# Patient Record
Sex: Female | Born: 1975 | Race: Black or African American | Hispanic: No | Marital: Single | Smoking: Never smoker
Health system: Southern US, Community
[De-identification: ages and names within clinical notes are randomized; demographics above are authoritative.]

## PROBLEM LIST (undated history)

## (undated) ENCOUNTER — Ambulatory Visit (HOSPITAL_COMMUNITY): Admission: EM | Payer: Self-pay | Source: Home / Self Care

## (undated) ENCOUNTER — Ambulatory Visit (HOSPITAL_COMMUNITY): Payer: Self-pay | Source: Home / Self Care

## (undated) DIAGNOSIS — T7840XA Allergy, unspecified, initial encounter: Secondary | ICD-10-CM

## (undated) HISTORY — DX: Allergy, unspecified, initial encounter: T78.40XA

## (undated) HISTORY — PX: TUBAL LIGATION: SHX77

---

## 1997-08-28 ENCOUNTER — Inpatient Hospital Stay (HOSPITAL_COMMUNITY): Admission: AD | Admit: 1997-08-28 | Discharge: 1997-08-28 | Payer: Self-pay | Admitting: *Deleted

## 1997-08-30 ENCOUNTER — Inpatient Hospital Stay (HOSPITAL_COMMUNITY): Admission: AD | Admit: 1997-08-30 | Discharge: 1997-08-30 | Payer: Self-pay | Admitting: Obstetrics

## 1997-09-29 ENCOUNTER — Ambulatory Visit (HOSPITAL_COMMUNITY): Admission: RE | Admit: 1997-09-29 | Discharge: 1997-09-29 | Payer: Self-pay | Admitting: Obstetrics

## 1997-10-29 ENCOUNTER — Inpatient Hospital Stay (HOSPITAL_COMMUNITY): Admission: AD | Admit: 1997-10-29 | Discharge: 1997-10-31 | Payer: Self-pay | Admitting: Obstetrics

## 1998-02-04 ENCOUNTER — Inpatient Hospital Stay (HOSPITAL_COMMUNITY): Admission: AD | Admit: 1998-02-04 | Discharge: 1998-02-04 | Payer: Self-pay | Admitting: *Deleted

## 1998-02-05 ENCOUNTER — Inpatient Hospital Stay (HOSPITAL_COMMUNITY): Admission: AD | Admit: 1998-02-05 | Discharge: 1998-02-07 | Payer: Self-pay | Admitting: Obstetrics

## 1998-03-21 ENCOUNTER — Emergency Department (HOSPITAL_COMMUNITY): Admission: EM | Admit: 1998-03-21 | Discharge: 1998-03-21 | Payer: Self-pay | Admitting: Emergency Medicine

## 1999-04-04 ENCOUNTER — Emergency Department (HOSPITAL_COMMUNITY): Admission: EM | Admit: 1999-04-04 | Discharge: 1999-04-04 | Payer: Self-pay | Admitting: Emergency Medicine

## 1999-10-10 ENCOUNTER — Emergency Department (HOSPITAL_COMMUNITY): Admission: EM | Admit: 1999-10-10 | Discharge: 1999-10-10 | Payer: Self-pay | Admitting: Emergency Medicine

## 1999-10-10 ENCOUNTER — Encounter: Payer: Self-pay | Admitting: Emergency Medicine

## 2000-04-03 ENCOUNTER — Ambulatory Visit (HOSPITAL_COMMUNITY): Admission: RE | Admit: 2000-04-03 | Discharge: 2000-04-03 | Payer: Self-pay | Admitting: *Deleted

## 2000-04-22 ENCOUNTER — Encounter: Admission: RE | Admit: 2000-04-22 | Discharge: 2000-04-22 | Payer: Self-pay | Admitting: Obstetrics & Gynecology

## 2000-05-22 ENCOUNTER — Observation Stay (HOSPITAL_COMMUNITY): Admission: AD | Admit: 2000-05-22 | Discharge: 2000-05-23 | Payer: Self-pay | Admitting: Obstetrics & Gynecology

## 2000-05-22 ENCOUNTER — Encounter: Payer: Self-pay | Admitting: Obstetrics & Gynecology

## 2000-06-10 ENCOUNTER — Inpatient Hospital Stay (HOSPITAL_COMMUNITY): Admission: AD | Admit: 2000-06-10 | Discharge: 2000-06-14 | Payer: Self-pay | Admitting: *Deleted

## 2000-06-10 ENCOUNTER — Encounter: Admission: RE | Admit: 2000-06-10 | Discharge: 2000-06-10 | Payer: Self-pay | Admitting: Obstetrics & Gynecology

## 2000-06-13 ENCOUNTER — Encounter: Payer: Self-pay | Admitting: *Deleted

## 2000-06-16 ENCOUNTER — Inpatient Hospital Stay (HOSPITAL_COMMUNITY): Admission: AD | Admit: 2000-06-16 | Discharge: 2000-06-16 | Payer: Self-pay | Admitting: Obstetrics

## 2000-07-21 ENCOUNTER — Inpatient Hospital Stay (HOSPITAL_COMMUNITY): Admission: AD | Admit: 2000-07-21 | Discharge: 2000-07-23 | Payer: Self-pay | Admitting: *Deleted

## 2000-07-21 ENCOUNTER — Encounter (INDEPENDENT_AMBULATORY_CARE_PROVIDER_SITE_OTHER): Payer: Self-pay

## 2000-07-21 ENCOUNTER — Encounter (INDEPENDENT_AMBULATORY_CARE_PROVIDER_SITE_OTHER): Payer: Self-pay | Admitting: Specialist

## 2001-02-19 ENCOUNTER — Emergency Department (HOSPITAL_COMMUNITY): Admission: EM | Admit: 2001-02-19 | Discharge: 2001-02-19 | Payer: Self-pay | Admitting: Emergency Medicine

## 2002-10-02 ENCOUNTER — Emergency Department (HOSPITAL_COMMUNITY): Admission: EM | Admit: 2002-10-02 | Discharge: 2002-10-02 | Payer: Self-pay | Admitting: Emergency Medicine

## 2002-10-12 ENCOUNTER — Emergency Department (HOSPITAL_COMMUNITY): Admission: EM | Admit: 2002-10-12 | Discharge: 2002-10-12 | Payer: Self-pay | Admitting: Emergency Medicine

## 2003-08-10 ENCOUNTER — Emergency Department (HOSPITAL_COMMUNITY): Admission: EM | Admit: 2003-08-10 | Discharge: 2003-08-10 | Payer: Self-pay | Admitting: Emergency Medicine

## 2003-11-23 ENCOUNTER — Emergency Department (HOSPITAL_COMMUNITY): Admission: EM | Admit: 2003-11-23 | Discharge: 2003-11-23 | Payer: Self-pay | Admitting: *Deleted

## 2003-11-24 ENCOUNTER — Inpatient Hospital Stay (HOSPITAL_COMMUNITY): Admission: RE | Admit: 2003-11-24 | Discharge: 2003-11-26 | Payer: Self-pay | Admitting: Otolaryngology

## 2004-02-07 ENCOUNTER — Emergency Department (HOSPITAL_COMMUNITY): Admission: EM | Admit: 2004-02-07 | Discharge: 2004-02-07 | Payer: Self-pay | Admitting: Emergency Medicine

## 2004-02-14 ENCOUNTER — Emergency Department (HOSPITAL_COMMUNITY): Admission: EM | Admit: 2004-02-14 | Discharge: 2004-02-15 | Payer: Self-pay | Admitting: Emergency Medicine

## 2004-08-27 ENCOUNTER — Emergency Department (HOSPITAL_COMMUNITY): Admission: EM | Admit: 2004-08-27 | Discharge: 2004-08-27 | Payer: Self-pay | Admitting: Emergency Medicine

## 2004-11-26 ENCOUNTER — Emergency Department (HOSPITAL_COMMUNITY): Admission: EM | Admit: 2004-11-26 | Discharge: 2004-11-26 | Payer: Self-pay | Admitting: Family Medicine

## 2007-05-03 ENCOUNTER — Emergency Department (HOSPITAL_COMMUNITY): Admission: EM | Admit: 2007-05-03 | Discharge: 2007-05-03 | Payer: Self-pay | Admitting: Emergency Medicine

## 2007-11-01 ENCOUNTER — Emergency Department (HOSPITAL_COMMUNITY): Admission: EM | Admit: 2007-11-01 | Discharge: 2007-11-01 | Payer: Self-pay | Admitting: Emergency Medicine

## 2007-11-03 ENCOUNTER — Emergency Department (HOSPITAL_COMMUNITY): Admission: EM | Admit: 2007-11-03 | Discharge: 2007-11-03 | Payer: Self-pay | Admitting: Emergency Medicine

## 2008-09-10 ENCOUNTER — Emergency Department (HOSPITAL_COMMUNITY): Admission: EM | Admit: 2008-09-10 | Discharge: 2008-09-10 | Payer: Self-pay | Admitting: Emergency Medicine

## 2010-01-17 ENCOUNTER — Emergency Department (HOSPITAL_COMMUNITY): Admission: EM | Admit: 2010-01-17 | Discharge: 2010-01-17 | Payer: Self-pay | Admitting: Emergency Medicine

## 2010-04-15 ENCOUNTER — Emergency Department (HOSPITAL_COMMUNITY): Admission: EM | Admit: 2010-04-15 | Discharge: 2010-04-15 | Payer: Self-pay | Admitting: Emergency Medicine

## 2010-10-03 LAB — WET PREP, GENITAL
Trich, Wet Prep: NONE SEEN
Yeast Wet Prep HPF POC: NONE SEEN

## 2010-10-03 LAB — URINALYSIS, ROUTINE W REFLEX MICROSCOPIC
Bilirubin Urine: NEGATIVE
Glucose, UA: NEGATIVE mg/dL
Hgb urine dipstick: NEGATIVE
Ketones, ur: NEGATIVE mg/dL
Nitrite: NEGATIVE
Protein, ur: NEGATIVE mg/dL
Specific Gravity, Urine: 1.025 (ref 1.005–1.030)
Urobilinogen, UA: 1 mg/dL (ref 0.0–1.0)
pH: 7.5 (ref 5.0–8.0)

## 2010-10-03 LAB — URINE MICROSCOPIC-ADD ON

## 2010-10-03 LAB — GC/CHLAMYDIA PROBE AMP, GENITAL
Chlamydia, DNA Probe: NEGATIVE
GC Probe Amp, Genital: NEGATIVE

## 2010-10-14 ENCOUNTER — Inpatient Hospital Stay (INDEPENDENT_AMBULATORY_CARE_PROVIDER_SITE_OTHER)
Admission: RE | Admit: 2010-10-14 | Discharge: 2010-10-14 | Disposition: A | Discharge status: Home / Self Care | Payer: Medicaid Other | Source: Ambulatory Visit | Attending: Family Medicine | Admitting: Family Medicine

## 2010-10-14 DIAGNOSIS — L989 Disorder of the skin and subcutaneous tissue, unspecified: Secondary | ICD-10-CM

## 2010-12-06 NOTE — Op Note (Signed)
NAMESADEEL, FIDDLER                            ACCOUNT NO.:  [id number]   MEDICAL RECORD NO.:  [id number]                   PATIENT TYPE:  INP   LOCATION:  5019                                 FACILITY:  [facility]   PHYSICIAN:  Suzanna Obey, M.D.                    DATE OF BIRTH:  June 27, 1976   DATE OF PROCEDURE:  11/25/2003  DATE OF DISCHARGE:                                 OPERATIVE REPORT   PREOPERATIVE DIAGNOSIS:  Left paratonsillar abscess.   POSTOPERATIVE DIAGNOSIS:  Left paratonsillar abscess.   SURGICAL PROCEDURE:  Incision and drainage of left paratonsillar abscess.   ANESTHESIA:  General endotracheal tube.   ESTIMATED BLOOD LOSS:  Approximately 5 mL.   INDICATIONS:  This is a 35 year old, who has had problems with increasing  sore throat and pain.  She was seen in the office and not noted to have a  problem with a paratonsillar abscess but rather just exudative material that  was on both tonsils.  She has not been able to take any fluids, and symptoms  have been going on for 2-3 days, so she was admitted to be changed to  intravenous antibiotics.  She, on the next morning, did not have any  substantial improvement.  She was having left ear pain.  She has bulging now  of the soft palate on the left side.  She was informed of the risks and  benefits of the procedure including bleeding, infection, scarring, chronic  pain, risk of the anesthetic.  All questions are answered, and consent was  obtained.   OPERATION:  The patient taken to the operating room and placed in supine  position.  After adequate general endotracheal tube anesthesia, was draped,  and then a Crowe-Davis mouth gag was inserted, retracted, and suspended from  the Mayo stand.  An anterior tonsillar pillar incision was made with  electrocautery.  Tonsil hemostat used to open up the paratonsillar space  which did have significant purulent material.  There was also a site of the  superior pole of the tonsil where  the abscess was trying to spontaneously  drain.  This was irrigated copiously with saline.  The hypopharynx,  esophagus, and stomach were suctioned with the NG tube.  The Crowe-Davis was  removed.  The patient was awakened and brought to recovery in stable  condition.  Counts correct.                                               Suzanna Obey, M.D.    Cordelia Pen  D:  11/25/2003  T:  11/26/2003  Job:  [id number]

## 2010-12-06 NOTE — Op Note (Signed)
[facility]  Patient:    Darlene Evans, Darlene Evans                         MRN: [id number] Proc. Date: 07/22/00 Adm. Date:  [id number] Disc. Date: [id number] Attending:  Tammi Sou Dictator:   Jamey Reas, M.D.                           Operative Report  DATE OF BIRTH:                1975-10-01  PREOPERATIVE DIAGNOSES:       Multiparity, desires permanent sterilization.  POSTOPERATIVE DIAGNOSES:      Multiparity, desires permanent sterilization.  PROCEDURE:                    Postpartum tubal ligation-Pomeroy method.  SURGEON:                      Charles A. Clearance Coots, M.D.  ASSISTANT:                    Jamey Reas, M.D.  ANESTHESIA:                   Epidural.  COMPLICATIONS:                None.  ESTIMATED BLOOD LOSS:         Less than 20 cc.  FLUIDS:                       Lactated Ringers 500 cc.  INDICATIONS:                  A 35 year old G61, P3-0-1-4 postpartum day #1 status post NSVD who desires permanent sterilization.  Risks and benefits of procedure discussed with patient including the risk of failure 3-5 in 1000 with increased risk of ectopic gestation if pregnancy occurs.  FINDINGS:                     Normal uterus, tubes, and ovaries.  PROCEDURE:                    Patient taken to the operating room where epidural was found to be adequate.  Small transverse infraumbilical skin incision was then made with the scalpel.  Incision was carried down through the underlying fascia until the peritoneum was identified and entered. Peritoneum was noted to be free of any adhesions and the incision was then extended with Metzenbaum scissors.  The patients right fallopian tube was then identified, brought to the incision, grasped with a Babcock clamp.  The tube was then followed out to the fimbria.  The Babcock clamp was then used to grasp the tube approximately 4 cm from the cornual region.  A 3 cm segment of tube  was then ligated with two free ties of plain gut and excised.  Good hemostasis was noted and the tube was returned to the abdomen.  The left fallopian tube was then ligated and a 3 cm segment excised in a similar fashion.  Excellent hemostasis was noted and the tube returned to the abdomen. Peritoneum and fascia were then closed in a single layer using 3-0 Vicryl suture.  The skin was closed in a subcuticular fashion using 4-0 Monocryl. The patient tolerated procedure well.  Sponge, lap, and needle counts were correct x 2.  Patient was taken to the recovery room in stable condition.  PATHOLOGY:                    Segments of right and left fallopian tubes.DD: 07/22/00 TD:  07/22/00 Job: [id number]

## 2010-12-06 NOTE — Discharge Summary (Signed)
[facility]  Patient:    Darlene Evans, Darlene Evans                         MRN: [id number] Adm. Date:  [id number] Disc. Date: 05/23/00 Attending:  Antionette Char Dictator:   Zella Ball, M.D.                           Discharge Summary  ADDENDUM  DISPOSITION:                  Patient was discharged to home in good condition.  MEDICATIONS:                  1. Prenatal vitamins.                               2. Phenergan 12.5 mg p.o. p.r.n. nausea.  SPECIAL INSTRUCTIONS:         Patient was instructed to keep herself well hydrated, drinking plenty of fluids.  FOLLOW-UP:                    Patient was instructed to follow up with her regular physician in one week. DD:  05/23/00 TD:  05/24/00 Job: [id number]

## 2010-12-06 NOTE — Discharge Summary (Signed)
[facility]  Patient:    Darlene Evans, Darlene Evans                         MRN: [id number] Adm. Date:  [id number] Disc. Date: 05/23/00 Attending:  Antionette Char Dictator:   Zella Ball, M.D.                           Discharge Summary  DATE OF BIRTH:                07/12/1976  DISCHARGE DIAGNOSES:          1. Intrauterine pregnancy, twin gestation, at 29                                  weeks.                               2. Gastroenteritis.  PRESENTING HISTORY:           This is a 35 year old G4, P2-0-1-2 who presented at 78 and 4 weeks by LMP and an ultrasound at 20 weeks who came in complaining of pain in her lower abdomen and xiphoid that had started the previous morning.  Nothing which increased or decreased the pain but she did have nausea and vomiting x 3 and felt dizzy when she stood up.  Otherwise, patient had positive fetal movements, was not feeling any contractions.  No spontaneous rupture of membranes and no bleeding.  However, she did have some discharge that she has had for apparently quite a while.  OBSTETRICAL HISTORY:          Patient had therapeutic abortion in 1994. Normal spontaneous vaginal delivery 6 pound 7 ounce in 1999.  A 6 pound 10 ounce normal spontaneous vaginal delivery in 1995.  SOCIAL HISTORY:               Patient does not smoke, drink alcohol, or use other substances.  PRENATAL HISTORY:             Patient was GBS negative in September 2001. Also, GC and chlamydia negative in September 2001.  Hep B surface antigen negative.  Her blood type is O+.  Negative VDRL.  PHYSICAL EXAMINATION:  VITAL SIGNS:                  Temperature 99.6.  Blood pressure 112/75.  Pulse 98.  Fetal heart tones 145-155.  No accelerations.  No decelerations.  Some uterine irritability noted on the monitor.  HEENT:                        Within normal limits with some submandibular lymphadenopathy.  LUNGS:                         Clear to auscultation bilaterally.  CHEST:                        Regular rate and rhythm with 2/6 systolic murmur.  ABDOMEN:                      Gravid.  PELVIC:  Cervix was soft, fingertip, and 30% effaced. Infant was in cephalic position.  LABORATORIES:                 Patient had an UA that was negative for nitrites, negative for leuk esterase.                                Patient was admitted with the diagnosis of dehydration, likely secondary to gastroenteritis.  HOSPITAL COURSE:              Patient was placed on IV Phenergan and fluid repleted with good change in status.  Patient vomited a couple more times while in the hospital but nothing in the previous 14 or so hours prior to discharge.  With hydration patient stated that she felt much better and overall stabilized.  Patient did have an obstetrical ultrasound while she was in the hospital and it showed for fetus 1 posterior grade 1 placenta, amniotic fluid index within normal limits for 27 weeks, cardiac motion detected, body and limb movements detected, lateral ventricles seen.  Everything appeared grossly normal on the ultrasound for fetus B.  Estimated fetal weight was 842 g.  Head circumference 23.9 cm, AC diameter 21.9, and FL 4.8, biparametal diameter 6.1.  For fetus A posterior grade 1 placenta, amniotic fluid index within normal limits, everything grossly normal on fetal motion and anatomy except breathing motions were not seen, biparametal diameter 6.9, head circumference 24.6, AC 23.6, FL 5.0, estimated fetal weight 1119 g. DD:  05/23/00 TD:  05/24/00 Job: [id number]

## 2010-12-06 NOTE — Discharge Summary (Signed)
[facility]  Patient:    Darlene Evans, Darlene Evans                         MRN: [id number] Adm. Date:  [id number] Disc. Date: 05/23/00 Attending:  Antionette Char Dictator:   Zella Ball, M.D.                           Discharge Summary  ADDENDUM  DISPOSITION:                  Patient was discharged to home in good health. DD:  05/23/00 TD:  05/24/00 Job: [id number]

## 2010-12-06 NOTE — Discharge Summary (Signed)
[facility]  Patient:    Darlene Evans, Darlene Evans                         MRN: [id number] Adm. Date:  [id number] Disc. Date: [id number] Attending:  Michaelle Copas Dictator:   Marvis Moeller, C.N.M.                           Discharge Summary  ADMISSION DIAGNOSES:          1. A 30 and 1/7 week intrauterine pregnancy,                                  twin gestation.                               2. Threatened preterm labor with cervical                                  change.  DISCHARGE DIAGNOSES:          1. A 30 and 5/7 week intrauterine pregnancy with                                  discordant twin gestation.                               2. Threatened preterm labor, stable, status post                                  Unasyn and betamethasone.  HISTORY OF PRESENT ILLNESS:   The patient is a 35 year old G4, P 2-0-1-2.  She presented for her scheduled visit at [facility] clinic on June 10, 2000, and was noted to have a cervix that was 1, 40%, with significant lower uterine segment development.  Of note, her ultrasound on June 01, 2000, had revealed a cervical length that was decreased to 1.9 cm with funneling present.  It was also noted on this scan that there was discordance of the twins with A in the 65th percentile and B in the 20th percentile.  Amniotic fluid volume was said to be normal x 2, and separating membrane was seen.  On day of admission, the patient was unaware of contractions, but had been feeling some lower abdominal pressure.  She denied leaking or bleeding.  No irritated vaginal discharge, no UTI symptoms, or preeclampsia symptoms.  Her prenatal course had been significant for late entry at [facility] at 22 weeks with transferal to high risk clinic due to the twin gestation.  PRENATAL LABORATORY DATA:     Significant for negative GBS, negative GC, and negative Chlamydia.  OBSTETRICAL AND GYNECOLOGIC HISTORY:  Significant  for an elective termination at 18 weeks due to fetal anomalies followed by two full-term normal spontaneous vaginal deliveries which occurred in 1995 and 1999.  Negative history of STD.  SOCIAL HISTORY:               Negative for tobacco, alcohol, or illicit drug use.  ADMISSION PHYSICAL EXAMINATION:  VITAL  SIGNS:                  WNL with blood pressure 113/72.  PELVIC:                       There was uterine irritability, but no uterine contractions on toco.  Fetal heart rate was in the 140s and reactive.  No decelerations.  Cervical exam was as above.  CHEST:                        WNL.  CARDIOVASCULAR:               WNL.  ABDOMEN:                      Soft, nontender, and consistent with a twin gestation at 30 weeks.  HOSPITAL COURSE:              The patient was admitted for tocolysis with magnesium sulfate and received a 4-gram load then run at 2 g/h.  She also received Unasyn 3 g IV q.6h., and she received betamethasone 12.5 mg x 2 over 24 hours.  Fetal fibronectin was sent which was subsequently found that the lab made an error, and this result was not available.  She remained quieted with a therapeutic magnesium level.  Of note, her hemoglobin was 7.9 with a low MCV of 81, and she was given iron therapy during hospitalization.  On hospital day #4, it was noted that her cervix was unchanged from admission exam, and both fetal heart rates were reassuring, the toco revealed at most one to two uterine contractions per hour, and all of her GC, CT, and GBS cultures were negative from this hospitalization.  Her magnesium infusion was discontinued one day prior to day of discharge.  Her ultrasound during hospitalization revealed twin A 36th percentile, twin B less than 10th percentile, both were cephalic, normal amniotic fluid volume x 2, and cervix was 1.8 cm essentially the same as it was three weeks ago.  DISPOSITION/FOLLOWUP:         On June 14, 2000, the patient was  discharged home on bed rest with follow-up at the high risk clinic in three days.  She was begun on IUGR protocol due to twin B being less than 10th percentile.  DISCHARGE MEDICATIONS:        1. Prenatal vitamin one q.d.                               2. FeSO4 325 mg one p.o. b.i.d.                               3. She was to take only Tylenol as necessary for                                  pain one or two q.4h. p.r.n.                               4. She was not given any oral tocolysis.  CONDITION ON DISCHARGE:       Stable. DD:  09/07/00 TD:  09/07/00 Job: [id number]

## 2011-04-20 ENCOUNTER — Emergency Department (HOSPITAL_COMMUNITY)
Admission: EM | Admit: 2011-04-20 | Discharge: 2011-04-20 | Disposition: A | Discharge status: Home / Self Care | Payer: Self-pay | Attending: Emergency Medicine | Admitting: Emergency Medicine

## 2011-04-20 DIAGNOSIS — K029 Dental caries, unspecified: Secondary | ICD-10-CM | POA: Insufficient documentation

## 2011-04-20 DIAGNOSIS — K089 Disorder of teeth and supporting structures, unspecified: Secondary | ICD-10-CM | POA: Insufficient documentation

## 2011-11-04 ENCOUNTER — Ambulatory Visit: Payer: Self-pay | Admitting: Family Medicine

## 2011-11-04 VITALS — BP 123/78 | HR 88 | Temp 97.5°F | Resp 16 | Ht 63.0 in | Wt 130.8 lb

## 2011-11-04 DIAGNOSIS — Z021 Encounter for pre-employment examination: Secondary | ICD-10-CM

## 2011-11-04 DIAGNOSIS — Z0289 Encounter for other administrative examinations: Secondary | ICD-10-CM

## 2011-11-04 NOTE — Progress Notes (Signed)
  [facility]:  Office Visit  Chief Complaint:  Chief Complaint  Patient presents with  . Annual Exam    admin    HPI: Darlene Evans is a 36 y.o. 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 She states she is UTD on her TB skin test and tetanus. She denies ever having TB. No problems physically, No cough, hemoptysis, SOB/CP. No anxiety, depression, SI/HI.   Past Medical History  Diagnosis Date  . Allergy    Past Surgical History  Procedure Date  . Tubal ligation    History   Social History  . Marital Status: Single    Spouse Name: N/A    Number of Children: N/A  . Years of Education: N/A   Social History Main Topics  . Smoking status: Never Smoker   . Smokeless tobacco: None  . Alcohol Use: No  . Drug Use: No  . Sexually Active: None   Other Topics Concern  . None   Social History Narrative  . None   Family History  Problem Relation Age of Onset  . Heart disease Maternal Grandmother    No Known Allergies Prior to Admission medications   Not on File     ROS: The patient denies fevers, chills, night sweats, unintentional weight loss, chest pain, palpitations, wheezing, dyspnea on exertion, nausea, vomiting, abdominal pain, dysuria, hematuria, melena, numbness, weakness, or tingling.   All other systems have been reviewed and were otherwise negative with the exception of those mentioned in the HPI and as above.    PHYSICAL EXAM: Filed Vitals:   11/04/11 1106  BP: 123/78  Pulse: 88  Temp: 97.5 F (36.4 C)  Resp: 16   Filed Vitals:   11/04/11 1106  Height: 5\' 3"  (1.6 m)  Weight: 130 lb 12.8 oz (59.33 kg)   Body mass index is 23.17 kg/(m^2).  General: Alert, no acute distress HEENT:  Normocephalic, atraumatic, oropharynx patent. EOMI, PERRLA Cardiovascular:  Regular rate and rhythm, no rubs murmurs or gallops.  No Carotid bruits, radial pulse intact. No pedal edema.  Respiratory: Clear to auscultation  bilaterally.  No wheezes, rales, or rhonchi.  No cyanosis, no use of accessory musculature GI: No organomegaly, abdomen is soft and non-tender, positive bowel sounds.  No masses. Skin: No rashes. Neurologic: Facial musculature symmetric. Psychiatric: Patient is appropriate throughout our interaction. Lymphatic: No cervical lymphadenopathy Musculoskeletal: Gait intact.   LABS:    EKG/XRAY:   Primary read interpreted by Dr. Conley Rolls at [facility].   ASSESSMENT/PLAN: Encounter Diagnosis  Name Primary?  . Physical exam, pre-employment Yes    Patient doing well. No physical restrictions based on today's exam.    Roark Rufo PHUONG, DO 11/07/2011 9:50 AM

## 2012-01-05 ENCOUNTER — Emergency Department (HOSPITAL_COMMUNITY)
Admission: EM | Admit: 2012-01-05 | Discharge: 2012-01-05 | Disposition: A | Discharge status: Home / Self Care | Payer: Self-pay | Attending: Emergency Medicine | Admitting: Emergency Medicine

## 2012-01-05 ENCOUNTER — Emergency Department (HOSPITAL_COMMUNITY): Payer: Self-pay

## 2012-01-05 ENCOUNTER — Encounter (HOSPITAL_COMMUNITY): Payer: Self-pay | Admitting: Emergency Medicine

## 2012-01-05 DIAGNOSIS — M62838 Other muscle spasm: Secondary | ICD-10-CM | POA: Insufficient documentation

## 2012-01-05 DIAGNOSIS — R079 Chest pain, unspecified: Secondary | ICD-10-CM | POA: Insufficient documentation

## 2012-01-05 DIAGNOSIS — M542 Cervicalgia: Secondary | ICD-10-CM | POA: Insufficient documentation

## 2012-01-05 LAB — CBC
HCT: 33.1 % — ABNORMAL LOW (ref 36.0–46.0)
Hemoglobin: 10.5 g/dL — ABNORMAL LOW (ref 12.0–15.0)
MCH: 25.3 pg — ABNORMAL LOW (ref 26.0–34.0)
MCHC: 31.7 g/dL (ref 30.0–36.0)
MCV: 79.8 fL (ref 78.0–100.0)
Platelets: 306 K/uL (ref 150–400)
RBC: 4.15 MIL/uL (ref 3.87–5.11)
RDW: 18.6 % — ABNORMAL HIGH (ref 11.5–15.5)
WBC: 4.3 K/uL (ref 4.0–10.5)

## 2012-01-05 LAB — POCT I-STAT TROPONIN I
Troponin i, poc: 0 ng/mL (ref 0.00–0.08)
Troponin i, poc: 0 ng/mL (ref 0.00–0.08)

## 2012-01-05 LAB — DIFFERENTIAL
Basophils Absolute: 0 K/uL (ref 0.0–0.1)
Basophils Relative: 0 % (ref 0–1)
Eosinophils Absolute: 0.1 K/uL (ref 0.0–0.7)
Eosinophils Relative: 1 % (ref 0–5)
Lymphocytes Relative: 47 % — ABNORMAL HIGH (ref 12–46)
Lymphs Abs: 2 K/uL (ref 0.7–4.0)
Monocytes Absolute: 0.3 K/uL (ref 0.1–1.0)
Monocytes Relative: 7 % (ref 3–12)
Neutro Abs: 1.9 K/uL (ref 1.7–7.7)
Neutrophils Relative %: 44 % (ref 43–77)

## 2012-01-05 LAB — POCT I-STAT, CHEM 8
BUN: 13 mg/dL (ref 6–23)
Calcium, Ion: 1.29 mmol/L (ref 1.12–1.32)
Chloride: 108 mEq/L (ref 96–112)
Glucose, Bld: 83 mg/dL (ref 70–99)

## 2012-01-05 MED ORDER — CYCLOBENZAPRINE HCL 10 MG PO TABS
10.0000 mg | ORAL_TABLET | Freq: Once | ORAL | Status: AC
  Administered 2012-01-05: 10 mg via ORAL
  Filled 2012-01-05: qty 1

## 2012-01-05 MED ORDER — KETOROLAC TROMETHAMINE 60 MG/2ML IM SOLN
60.0000 mg | Freq: Once | INTRAMUSCULAR | Status: AC
  Administered 2012-01-05: 60 mg via INTRAMUSCULAR
  Filled 2012-01-05: qty 2

## 2012-01-05 MED ORDER — CYCLOBENZAPRINE HCL 10 MG PO TABS: 10.0000 mg | ORAL_TABLET | Freq: Two times a day (BID) | ORAL | Status: AC | PRN

## 2012-01-05 NOTE — ED Provider Notes (Signed)
History     CSN: [id number]  Arrival date & time 01/05/12  1016   First MD Initiated Contact with Patient 01/05/12 1522      Chief Complaint  Patient presents with  . Chest Pain     Patient is a 36 y.o. female presenting with neck injury. The history is provided by the patient.  Neck Injury This is a new problem. The current episode started in the past 7 days (3 days ago). The problem occurs constantly. The problem has been unchanged. Associated symptoms include chest pain (at times neck pain is so great that it feels like it is in her chest) and myalgias (left neck pain). Pertinent negatives include no chills, coughing, fever, neck pain, numbness or weakness. The symptoms are aggravated by bending and twisting (twisting of neck). She has tried NSAIDs for the symptoms. The treatment provided moderate relief.    Past Medical History  Diagnosis Date  . Allergy     Past Surgical History  Procedure Date  . Tubal ligation     Family History  Problem Relation Age of Onset  . Heart disease Maternal Grandmother     History  Substance Use Topics  . Smoking status: Never Smoker   . Smokeless tobacco: Not on file  . Alcohol Use: No    OB History    Grav Para Term Preterm Abortions TAB SAB Ect Mult Living                  Review of Systems  Constitutional: Negative for fever, chills, activity change and appetite change.  HENT: Negative for neck pain and neck stiffness.   Respiratory: Negative for cough, chest tightness, shortness of breath and wheezing.   Cardiovascular: Positive for chest pain (at times neck pain is so great that it feels like it is in her chest). Negative for palpitations.  Genitourinary: Negative for dysuria, decreased urine volume and difficulty urinating.  Musculoskeletal: Positive for myalgias (left neck pain).  Neurological: Negative for dizziness, seizures, syncope, facial asymmetry, speech difficulty, weakness, light-headedness and numbness.    Psychiatric/Behavioral: Negative for confusion and agitation.  All other systems reviewed and are negative.    Allergies  Review of patient's allergies indicates no known allergies.  Home Medications   Current Outpatient Rx  Name Route Sig Dispense Refill  . BENADRYL PO Oral Take 1 tablet by mouth at bedtime.    Marland Kitchen CLARITIN PO Oral Take 1 tablet by mouth daily.    . ALEVE PO Oral Take 4 tablets by mouth once as needed. For pain    . OVER THE COUNTER MEDICATION Both Eyes Place 2 drops into both eyes daily as needed. For allergies. Visine.      BP 132/87  Pulse 86  Temp 98.5 F (36.9 C) (Oral)  Resp 16  SpO2 100%  LMP 12/22/2011  Physical Exam  Nursing note and vitals reviewed. Constitutional: She is oriented to person, place, and time. She appears well-developed and well-nourished.  HENT:  Head: Normocephalic and atraumatic.  Right Ear: External ear normal.  Left Ear: External ear normal.  Nose: Nose normal.  Mouth/Throat: Oropharynx is clear and moist. No oropharyngeal exudate.  Eyes: Conjunctivae are normal. Pupils are equal, round, and reactive to light.  Neck: Normal range of motion. Neck supple.    Cardiovascular: Normal rate, regular rhythm, normal heart sounds and intact distal pulses.  Exam reveals no gallop and no friction rub.   No murmur heard. Pulmonary/Chest: Effort normal and breath sounds  normal. No respiratory distress. She has no wheezes. She has no rales. She exhibits no tenderness.  Musculoskeletal: Normal range of motion. She exhibits no edema and no tenderness.  Neurological: She is alert and oriented to person, place, and time. She displays normal reflexes. No cranial nerve deficit. She exhibits normal muscle tone. Coordination normal.  Skin: Skin is warm and dry.  Psychiatric: She has a normal mood and affect. Her behavior is normal. Judgment and thought content normal.    ED Course  Procedures (including critical care time)  Labs Reviewed   CBC - Abnormal; Notable for the following:    Hemoglobin 10.5 (*)     HCT 33.1 (*)     MCH 25.3 (*)     RDW 18.6 (*)     All other components within normal limits  DIFFERENTIAL - Abnormal; Notable for the following:    Lymphocytes Relative 47 (*)     All other components within normal limits  POCT I-STAT, CHEM 8 - Abnormal; Notable for the following:    Hemoglobin 11.9 (*)     HCT 35.0 (*)     All other components within normal limits  POCT I-STAT TROPONIN I  POCT I-STAT TROPONIN I   Dg Chest 2 View  01/05/2012  *RADIOLOGY REPORT*  Clinical Data:  Shortness of breath, chest and left shoulder pain  CHEST - 2 VIEW  Comparison: None  Findings: Normal heart size, mediastinal contours, and pulmonary vascularity. Lungs clear. No pleural effusion or pneumothorax. No acute osseous findings.  IMPRESSION: No acute abnormalities.  Original Report Authenticated By: Lollie Marrow, M.D.     1. Neck muscle spasm      Date: 01/05/2012  Rate: 86 bpm  Rhythm: normal sinus rhythm  QRS Axis: normal  Intervals: normal  ST/T Wave abnormalities: normal  Conduction Disutrbances:none  Narrative Interpretation: No evidence of acute ischemia or arrythmia  Old EKG Reviewed: none available    MDM  36 yo otherwise healthy F presents for 3 days of left paraspinal neck pain, reproducible with rotation of head and improved with abduction of ipsilateral arm. No associated neuro si/sx. Suspect muscle strain and spasm. Triage had ordered serial troponins because pain "was so great it went to" pt's chest; these serial enzymes were negative in this patient with a clinical picture not concerning for ACS. Pain resolved with doses of IM toradol and PO flexeril. Will prescribe short course of PO flexeril. Pt given instructions to not drive while under the influence of Flexeril. Patient given return precautions, including worsening of signs or symptoms. Patient instructed to follow-up with primary care physician.           Clemetine Marker, MD 01/05/12 [phone]

## 2012-01-05 NOTE — ED Provider Notes (Signed)
I saw and evaluated the patient, reviewed the resident's note and I agree with the findings and plan.  Reviewed EKG  Pt with Bil neck muscle spasm. No concern for ACS.   Elyan Vanwieren B. Bernette Mayers, MD 01/05/12 1630

## 2012-01-05 NOTE — ED Notes (Signed)
Patient is alert and oriented x3.  Patient did understand discharge instructions with no questions asked.  Patient's vitals were recorded.  Mr. Bonney Aid is taking patient home.  No further action taken.

## 2012-01-05 NOTE — ED Notes (Signed)
Pt. Returned from Xray 

## 2012-01-05 NOTE — ED Notes (Signed)
Onset 3 days ago posterior neck pain radiating to center of chest. States pain slight relief with aleeve 4 tablets. Currently pain 9/10 achy dull.

## 2012-01-05 NOTE — Discharge Instructions (Signed)
Muscle Strain A muscle strain, or pulled muscle, occurs when a muscle is over-stretched. A small number of muscle fibers may also be torn. This is especially common in athletes. This happens when a sudden violent force placed on a muscle pushes it past its capacity. Usually, recovery from a pulled muscle takes 1 to 2 weeks. But complete healing will take 5 to 6 weeks. There are millions of muscle fibers. Following injury, your body will usually return to normal quickly. HOME CARE INSTRUCTIONS   While awake, apply ice to the sore muscle for 15 to 20 minutes each hour for the first 2 days. Put ice in a plastic bag and place a towel between the bag of ice and your skin.   Do not use the pulled muscle for several days. Do not use the muscle if you have pain.   You may wrap the injured area with an elastic bandage for comfort. Be careful not to bind it too tightly. This may interfere with blood circulation.   Only take over-the-counter or prescription medicines for pain, discomfort, or fever as directed by your caregiver. Do not use aspirin as this will increase bleeding (bruising) at injury site.   Warming up before exercise helps prevent muscle strains.  SEEK MEDICAL CARE IF:  There is increased pain or swelling in the affected area. MAKE SURE YOU:   Understand these instructions.   Will watch your condition.   Will get help right away if you are not doing well or get worse.  Document Released: 07/07/2005 Document Revised: 06/26/2011 Document Reviewed: 02/03/2007 [employer] Patient Information 2012 [employer]. 

## 2012-08-02 ENCOUNTER — Emergency Department (HOSPITAL_COMMUNITY)
Admission: EM | Admit: 2012-08-02 | Discharge: 2012-08-02 | Disposition: A | Discharge status: Home / Self Care | Payer: Self-pay | Attending: Emergency Medicine | Admitting: Emergency Medicine

## 2012-08-02 ENCOUNTER — Encounter (HOSPITAL_COMMUNITY): Payer: Self-pay | Admitting: Emergency Medicine

## 2012-08-02 ENCOUNTER — Emergency Department (HOSPITAL_COMMUNITY): Payer: Self-pay

## 2012-08-02 DIAGNOSIS — B349 Viral infection, unspecified: Secondary | ICD-10-CM

## 2012-08-02 DIAGNOSIS — R05 Cough: Secondary | ICD-10-CM | POA: Insufficient documentation

## 2012-08-02 DIAGNOSIS — H9209 Otalgia, unspecified ear: Secondary | ICD-10-CM | POA: Insufficient documentation

## 2012-08-02 DIAGNOSIS — R5383 Other fatigue: Secondary | ICD-10-CM | POA: Insufficient documentation

## 2012-08-02 DIAGNOSIS — B9789 Other viral agents as the cause of diseases classified elsewhere: Secondary | ICD-10-CM | POA: Insufficient documentation

## 2012-08-02 DIAGNOSIS — J3489 Other specified disorders of nose and nasal sinuses: Secondary | ICD-10-CM | POA: Insufficient documentation

## 2012-08-02 DIAGNOSIS — R197 Diarrhea, unspecified: Secondary | ICD-10-CM | POA: Insufficient documentation

## 2012-08-02 DIAGNOSIS — R5381 Other malaise: Secondary | ICD-10-CM | POA: Insufficient documentation

## 2012-08-02 DIAGNOSIS — J029 Acute pharyngitis, unspecified: Secondary | ICD-10-CM | POA: Insufficient documentation

## 2012-08-02 DIAGNOSIS — R059 Cough, unspecified: Secondary | ICD-10-CM | POA: Insufficient documentation

## 2012-08-02 MED ORDER — ONDANSETRON 4 MG PO TBDP
8.0000 mg | ORAL_TABLET | Freq: Once | ORAL | Status: AC
  Administered 2012-08-02: 8 mg via ORAL
  Filled 2012-08-02: qty 2

## 2012-08-02 MED ORDER — PROMETHAZINE HCL 12.5 MG PO TABS: 12.5000 mg | ORAL_TABLET | Freq: Four times a day (QID) | ORAL | Status: DC | PRN

## 2012-08-02 NOTE — ED Notes (Signed)
Patient transported to X-ray 

## 2012-08-02 NOTE — ED Provider Notes (Signed)
History     CSN: [id number]  Arrival date & time 08/02/12  1478   First MD Initiated Contact with Patient 08/02/12 1041      Chief Complaint  Patient presents with  . Emesis  . Diarrhea    (Consider location/radiation/quality/duration/timing/severity/associated sxs/prior treatment) HPI [name] is a 37 y.o. female who presents to ED with complaint of cough, nasal congestion, sore throat, nausea, vomiting, diarrhea for 4 days. 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 States has been trying to take over the counter medications such as nyquil, mucinex, benadryl, tehraflu with no relief. States unable to keep any food or fluids down. No fever. No chest pain or abdominal pain.   Past Medical History  Diagnosis Date  . Allergy     Past Surgical History  Procedure Date  . Tubal ligation     Family History  Problem Relation Age of Onset  . Heart disease Maternal Grandmother     History  Substance Use Topics  . Smoking status: Never Smoker   . Smokeless tobacco: Not on file  . Alcohol Use: No    OB History    Grav Para Term Preterm Abortions TAB SAB Ect Mult Living                  Review of Systems  Constitutional: Positive for chills and fatigue. Negative for fever.  HENT: Positive for ear pain, congestion and sore throat. Negative for neck pain and neck stiffness.   Respiratory: Positive for cough. Negative for shortness of breath and wheezing.   Cardiovascular: Negative for chest pain, palpitations and leg swelling.  Gastrointestinal: Positive for nausea, vomiting and diarrhea. Negative for abdominal pain.  Genitourinary: Negative for dysuria and flank pain.  Musculoskeletal: Positive for myalgias and arthralgias.  Skin: Negative for rash.  Neurological: Positive for weakness. Negative for dizziness and headaches.    Allergies  Review of patient's allergies indicates no known allergies.  Home Medications   Current Outpatient Rx  Name  Route   Sig  Dispense  Refill  . THERAFLU FLU/COLD PO   Oral   Take 1 packet by mouth daily as needed. For cold         . BENADRYL PO   Oral   Take 1 tablet by mouth daily as needed. For allergies         . MUCINEX PO   Oral   Take 1 tablet by mouth every 4 (four) hours as needed. Cold and congestion         . NYQUIL PO   Oral   Take 30 mLs by mouth at bedtime as needed. For cold and congestion           BP 118/76  Pulse 81  Temp 99.1 F (37.3 C) (Oral)  SpO2 100%  Physical Exam  Nursing note and vitals reviewed. Constitutional: She appears well-developed and well-nourished. No distress.  HENT:  Head: Normocephalic.  Eyes: Conjunctivae normal are normal.  Neck: Neck supple.  Cardiovascular: Normal rate and regular rhythm.   Pulmonary/Chest: Effort normal and breath sounds normal. No respiratory distress. She has no wheezes. She has no rales.  Abdominal: Soft. Bowel sounds are normal. She exhibits no distension. There is no tenderness. There is no rebound and no guarding.  Musculoskeletal: She exhibits no edema.  Neurological: She is alert.  Skin: Skin is warm and dry.  Psychiatric: She has a normal mood and affect.    ED Course  Procedures (  including critical care time)  Pt with n/v/d, URI symptoms. Unable to keep stuff down. She is non toxic appearing on my exam. VS all within normal. Will get zofran 8mg  ODT. CXR pending.   Dg Chest 2 View  08/02/2012  *RADIOLOGY REPORT*  Clinical Data: Cough, weakness, vomiting, diarrhea  CHEST - 2 VIEW  Comparison: 01/05/2012  Findings: Normal heart size, mediastinal contours, and pulmonary vascularity. Lungs clear. No pleural effusion or pneumothorax. No acute osseous findings.  IMPRESSION: Normal exam.   Original Report Authenticated By: Ulyses Southward, M.D.        1. Viral syndrome       MDM  Pt with viral syndrome. VS normal. She is non toxic. She is tolerating PO fluids after given zofran 8mg  ODT. X-ray negative. Will  d/c home with cough medications, symptomatic treatment. Suspect viral infection. Follow up if not improving in 2 days.   Filed Vitals:   08/02/12 1247  BP: 123/83  Pulse: 67  Temp: 98.2 F (36.8 C)  Resp: [address] A Karra Pink, [place] 08/02/12 1636

## 2012-08-02 NOTE — ED Notes (Signed)
C/o intermittent V/D X4-5, no fever, also c/o cough/congestion, sts no relief with OTC meds, NAD

## 2012-08-02 NOTE — ED Notes (Signed)
Pt started 4 days ago with sore throat, nasal congestion, cough. Yesterday, started with vomiting. States abdomen is "sore". Has had severe cough. Taking OTC cold meds.

## 2012-08-02 NOTE — ED Notes (Signed)
Returned from xray

## 2012-08-03 NOTE — ED Provider Notes (Signed)
Medical screening examination/treatment/procedure(s) were performed by non-physician practitioner and as supervising physician I was immediately available for consultation/collaboration.   Charles B. Bernette Mayers, MD 08/03/12 3086

## 2014-03-15 ENCOUNTER — Emergency Department (HOSPITAL_COMMUNITY)
Admission: EM | Admit: 2014-03-15 | Discharge: 2014-03-15 | Disposition: A | Discharge status: Home / Self Care | Payer: Medicaid Other | Attending: Family Medicine | Admitting: Family Medicine

## 2014-03-15 ENCOUNTER — Encounter (HOSPITAL_COMMUNITY): Payer: Self-pay | Admitting: Emergency Medicine

## 2014-03-15 ENCOUNTER — Emergency Department (HOSPITAL_COMMUNITY): Payer: Medicaid Other

## 2014-03-15 DIAGNOSIS — Y9389 Activity, other specified: Secondary | ICD-10-CM | POA: Insufficient documentation

## 2014-03-15 DIAGNOSIS — S0990XA Unspecified injury of head, initial encounter: Secondary | ICD-10-CM | POA: Insufficient documentation

## 2014-03-15 DIAGNOSIS — S0993XA Unspecified injury of face, initial encounter: Secondary | ICD-10-CM | POA: Insufficient documentation

## 2014-03-15 DIAGNOSIS — Y92410 Unspecified street and highway as the place of occurrence of the external cause: Secondary | ICD-10-CM | POA: Insufficient documentation

## 2014-03-15 DIAGNOSIS — S3981XA Other specified injuries of abdomen, initial encounter: Secondary | ICD-10-CM | POA: Insufficient documentation

## 2014-03-15 DIAGNOSIS — IMO0002 Reserved for concepts with insufficient information to code with codable children: Secondary | ICD-10-CM | POA: Insufficient documentation

## 2014-03-15 DIAGNOSIS — S199XXA Unspecified injury of neck, initial encounter: Secondary | ICD-10-CM

## 2014-03-15 DIAGNOSIS — S060X0A Concussion without loss of consciousness, initial encounter: Secondary | ICD-10-CM | POA: Insufficient documentation

## 2014-03-15 MED ORDER — HYDROCODONE-ACETAMINOPHEN 5-325 MG PO TABS: 1.0000 | ORAL_TABLET | Freq: Four times a day (QID) | ORAL | Status: DC | PRN

## 2014-03-15 MED ORDER — IBUPROFEN 800 MG PO TABS: 800.0000 mg | ORAL_TABLET | Freq: Three times a day (TID) | ORAL | Status: DC

## 2014-03-15 MED ORDER — OXYCODONE-ACETAMINOPHEN 5-325 MG PO TABS
1.0000 | ORAL_TABLET | Freq: Once | ORAL | Status: AC
  Administered 2014-03-15: 1 via ORAL
  Filled 2014-03-15: qty 1
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
## 2014-03-15 NOTE — Discharge Instructions (Signed)
Concussion °A concussion, or closed-head injury, is a brain injury caused by a direct blow to the head or by a quick and sudden movement (jolt) of the head or neck. Concussions are usually not life-threatening. Even so, the effects of a concussion can be serious. If you have had a concussion before, you are more likely to experience concussion-like symptoms after a direct blow to the head.  °CAUSES °· Direct blow to the head, such as from running into another player during a soccer game, being hit in a fight, or hitting your head on a hard surface. °· A jolt of the head or neck that causes the brain to move back and forth inside the skull, such as in a car crash. °SIGNS AND SYMPTOMS °The signs of a concussion can be hard to notice. Early on, they may be missed by you, family members, and health care providers. You may look fine but act or feel differently. °Symptoms are usually temporary, but they may last for days, weeks, or even longer. Some symptoms may appear right away while others may not show up for hours or days. Every head injury is different. Symptoms include: °· Mild to moderate headaches that will not go away. °· A feeling of pressure inside your head. °· Having more trouble than usual: °· Learning or remembering things you have heard. °· Answering questions. °· Paying attention or concentrating. °· Organizing daily tasks. °· Making decisions and solving problems. °· Slowness in thinking, acting or reacting, speaking, or reading. °· Getting lost or being easily confused. °· Feeling tired all the time or lacking energy (fatigued). °· Feeling drowsy. °· Sleep disturbances. °· Sleeping more than usual. °· Sleeping less than usual. °· Trouble falling asleep. °· Trouble sleeping (insomnia). °· Loss of balance or feeling lightheaded or dizzy. °· Nausea or vomiting. °· Numbness or tingling. °· Increased sensitivity to: °· Sounds. °· Lights. °· Distractions. °· Vision problems or eyes that tire  easily. °· Diminished sense of taste or smell. °· Ringing in the ears. °· Mood changes such as feeling sad or anxious. °· Becoming easily irritated or angry for little or no reason. °· Lack of motivation. °· Seeing or hearing things other people do not see or hear (hallucinations). °DIAGNOSIS °Your health care provider can usually diagnose a concussion based on a description of your injury and symptoms. He or she will ask whether you passed out (lost consciousness) and whether you are having trouble remembering events that happened right before and during your injury. °Your evaluation might include: °· A brain scan to look for signs of injury to the brain. Even if the test shows no injury, you may still have a concussion. °· Blood tests to be sure other problems are not present. °TREATMENT °· Concussions are usually treated in an emergency department, in urgent care, or at a clinic. You may need to stay in the hospital overnight for further treatment. °· Tell your health care provider if you are taking any medicines, including prescription medicines, over-the-counter medicines, and natural remedies. Some medicines, such as blood thinners (anticoagulants) and aspirin, may increase the chance of complications. Also tell your health care provider whether you have had alcohol or are taking illegal drugs. This information may affect treatment. °· Your health care provider will send you home with important instructions to follow. °· How fast you will recover from a concussion depends on many factors. These factors include how severe your concussion is, what part of your brain was injured, your   age, and how healthy you were before the concussion. °· Most people with mild injuries recover fully. Recovery can take time. In general, recovery is slower in older persons. Also, persons who have had a concussion in the past or have other medical problems may find that it takes longer to recover from their current injury. °HOME  CARE INSTRUCTIONS °General Instructions °· Carefully follow the directions your health care provider gave you. °· Only take over-the-counter or prescription medicines for pain, discomfort, or fever as directed by your health care provider. °· Take only those medicines that your health care provider has approved. °· Do not drink alcohol until your health care provider says you are well enough to do so. Alcohol and certain other drugs may slow your recovery and can put you at risk of further injury. °· If it is harder than usual to remember things, write them down. °· If you are easily distracted, try to do one thing at a time. For example, do not try to watch TV while fixing dinner. °· Talk with family members or close friends when making important decisions. °· Keep all follow-up appointments. Repeated evaluation of your symptoms is recommended for your recovery. °· Watch your symptoms and tell others to do the same. Complications sometimes occur after a concussion. Older adults with a brain injury may have a higher risk of serious complications, such as a blood clot on the brain. °· Tell your teachers, school nurse, school counselor, coach, athletic trainer, or work manager about your injury, symptoms, and restrictions. Tell them about what you can or cannot do. They should watch for: °¨ Increased problems with attention or concentration. °¨ Increased difficulty remembering or learning new information. °¨ Increased time needed to complete tasks or assignments. °¨ Increased irritability or decreased ability to cope with stress. °¨ Increased symptoms. °· Rest. Rest helps the brain to heal. Make sure you: °¨ Get plenty of sleep at night. Avoid staying up late at night. °¨ Keep the same bedtime hours on weekends and weekdays. °¨ Rest during the day. Take daytime naps or rest breaks when you feel tired. °· Limit activities that require a lot of thought or concentration. These include: °¨ Doing homework or job-related  work. °¨ Watching TV. °¨ Working on the computer. °· Avoid any situation where there is potential for another head injury (football, hockey, soccer, basketball, martial arts, downhill snow sports and horseback riding). Your condition will get worse every time you experience a concussion. You should avoid these activities until you are evaluated by the appropriate follow-up health care providers. °Returning To Your Regular Activities °You will need to return to your normal activities slowly, not all at once. You must give your body and brain enough time for recovery. °· Do not return to sports or other athletic activities until your health care provider tells you it is safe to do so. °· Ask your health care provider when you can drive, ride a bicycle, or operate heavy machinery. Your ability to react may be slower after a brain injury. Never do these activities if you are dizzy. °· Ask your health care provider about when you can return to work or school. °Preventing Another Concussion °It is very important to avoid another brain injury, especially before you have recovered. In rare cases, another injury can lead to permanent brain damage, brain swelling, or death. The risk of this is greatest during the first 7-10 days after a head injury. Avoid injuries by: °· Wearing a seat   belt when riding in a car. °· Drinking alcohol only in moderation. °· Wearing a helmet when biking, skiing, skateboarding, skating, or doing similar activities. °· Avoiding activities that could lead to a second concussion, such as contact or recreational sports, until your health care provider says it is okay. °· Taking safety measures in your home. °¨ Remove clutter and tripping hazards from floors and stairways. °¨ Use grab bars in bathrooms and handrails by stairs. °¨ Place non-slip mats on floors and in bathtubs. °¨ Improve lighting in dim areas. °SEEK MEDICAL CARE IF: °· You have increased problems paying attention or  concentrating. °· You have increased difficulty remembering or learning new information. °· You need more time to complete tasks or assignments than before. °· You have increased irritability or decreased ability to cope with stress. °· You have more symptoms than before. °Seek medical care if you have any of the following symptoms for more than 2 weeks after your injury: °· Lasting (chronic) headaches. °· Dizziness or balance problems. °· Nausea. °· Vision problems. °· Increased sensitivity to noise or light. °· Depression or mood swings. °· Anxiety or irritability. °· Memory problems. °· Difficulty concentrating or paying attention. °· Sleep problems. °· Feeling tired all the time. °SEEK IMMEDIATE MEDICAL CARE IF: °· You have severe or worsening headaches. These may be a sign of a blood clot in the brain. °· You have weakness (even if only in one hand, leg, or part of the face). °· You have numbness. °· You have decreased coordination. °· You vomit repeatedly. °· You have increased sleepiness. °· One pupil is larger than the other. °· You have convulsions. °· You have slurred speech. °· You have increased confusion. This may be a sign of a blood clot in the brain. °· You have increased restlessness, agitation, or irritability. °· You are unable to recognize people or places. °· You have neck pain. °· It is difficult to wake you up. °· You have unusual behavior changes. °· You lose consciousness. °MAKE SURE YOU: °· Understand these instructions. °· Will watch your condition. °· Will get help right away if you are not doing well or get worse. °Document Released: 09/27/2003 Document Revised: 07/12/2013 Document Reviewed: 01/27/2013 °[employer]® Patient Information ©2015 [employer]. This information is not intended to replace advice given to you by your health care provider. Make sure you discuss any questions you have with your health care provider. ° °Motor Vehicle Collision °It is common to have multiple bruises  and sore muscles after a motor vehicle collision (MVC). These tend to feel worse for the first 24 hours. You may have the most stiffness and soreness over the first several hours. You may also feel worse when you wake up the first morning after your collision. After this point, you will usually begin to improve with each day. The speed of improvement often depends on the severity of the collision, the number of injuries, and the location and nature of these injuries. °HOME CARE INSTRUCTIONS °· Put ice on the injured area. °¨ Put ice in a plastic bag. °¨ Place a towel between your skin and the bag. °¨ Leave the ice on for 15-20 minutes, 3-4 times a day, or as directed by your health care provider. °· Drink enough fluids to keep your urine clear or pale yellow. Do not drink alcohol. °· Take a warm shower or bath once or twice a day. This will increase blood flow to sore muscles. °· You may return to   activities as directed by your caregiver. Be careful when lifting, as this may aggravate neck or back pain. °· Only take over-the-counter or prescription medicines for pain, discomfort, or fever as directed by your caregiver. Do not use aspirin. This may increase bruising and bleeding. °SEEK IMMEDIATE MEDICAL CARE IF: °· You have numbness, tingling, or weakness in the arms or legs. °· You develop severe headaches not relieved with medicine. °· You have severe neck pain, especially tenderness in the middle of the back of your neck. °· You have changes in bowel or bladder control. °· There is increasing pain in any area of the body. °· You have shortness of breath, light-headedness, dizziness, or fainting. °· You have chest pain. °· You feel sick to your stomach (nauseous), throw up (vomit), or sweat. °· You have increasing abdominal discomfort. °· There is blood in your urine, stool, or vomit. °· You have pain in your shoulder (shoulder strap areas). °· You feel your symptoms are getting worse. °MAKE SURE YOU: °· Understand  these instructions. °· Will watch your condition. °· Will get help right away if you are not doing well or get worse. °Document Released: 07/07/2005 Document Revised: 11/21/2013 Document Reviewed: 12/04/2010 °[employer]® Patient Information ©2015 [employer]. This information is not intended to replace advice given to you by your health care provider. Make sure you discuss any questions you have with your health care provider. ° °
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
## 2014-03-21 NOTE — ED Provider Notes (Signed)
Medical screening examination/treatment/procedure(s) were performed by resident physician or non-physician practitioner and as supervising physician I was immediately available for consultation/collaboration.   Timouthy Gilardi DOUGLAS MD.   Ugo Thoma D Abel Hageman, MD 03/21/14 1226 

## 2014-04-19 ENCOUNTER — Encounter (HOSPITAL_COMMUNITY): Payer: Self-pay | Admitting: Emergency Medicine

## 2014-04-19 ENCOUNTER — Emergency Department (HOSPITAL_COMMUNITY)
Admission: EM | Admit: 2014-04-19 | Discharge: 2014-04-19 | Disposition: A | Discharge status: Home / Self Care | Payer: No Typology Code available for payment source | Attending: Emergency Medicine | Admitting: Emergency Medicine

## 2014-04-19 DIAGNOSIS — B372 Candidiasis of skin and nail: Secondary | ICD-10-CM | POA: Insufficient documentation

## 2014-04-19 DIAGNOSIS — N898 Other specified noninflammatory disorders of vagina: Secondary | ICD-10-CM | POA: Insufficient documentation

## 2014-04-19 DIAGNOSIS — R21 Rash and other nonspecific skin eruption: Secondary | ICD-10-CM | POA: Insufficient documentation

## 2014-04-19 DIAGNOSIS — Z79899 Other long term (current) drug therapy: Secondary | ICD-10-CM | POA: Insufficient documentation

## 2014-04-19 DIAGNOSIS — R110 Nausea: Secondary | ICD-10-CM | POA: Insufficient documentation

## 2014-04-19 MED ORDER — NYSTATIN 100000 UNIT/GM EX CREA: TOPICAL_CREAM | CUTANEOUS | Status: DC
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
## 2014-04-19 NOTE — ED Provider Notes (Signed)
CSN: [id number]     Arrival date & time 04/19/14  1002 History  This chart was scribed for non-physician practitioner, Ladona Mow, PA-C,working with Richardean Canal, MD, by Karle Plumber, ED Scribe. This patient was seen in room TR10C/TR10C and the patient's care was started at 10:56 AM.  No chief complaint on file.  The history is provided by the patient. No language interpreter was used.   HPI Comments:  Darlene Evans is a 38 y.o. female who presents to the Emergency Department complaining of a burning painful and itching rash that started approximately three weeks ago on her abdomen. She states she applied OTC hydrocortisone cream that gave her temporary relief. She states the rash has since returned to her bilateral breasts, right axilla, back, neck, suprapubic area and left inner thigh. Reports a mild sore throat and one episode of emesis one week ago that has since resolved. Pt states she is nauseous "all the time". She reports some mild vaginal pain she states is similar to the pain from the rash. 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 She states she is outside with her job sometimes. She denies any contact with anyone with similar symptoms. She denies any new soaps, detergents, creams, deodorants or foods. She denies any presentation on her palms or plantar surface of her feet. She denies fever, chills, HA, cough or difficulty breathing. She denies h/o eczema. She denies any gardening or working with plants. All vaccinations are UTD. LMP was at the beginning of this months. Pt is sexually active with one person.  Past Medical History  Diagnosis Date  . Allergy    Past Surgical History  Procedure Laterality Date  . Tubal ligation     Family History  Problem Relation Age of Onset  . Heart disease Maternal Grandmother    History  Substance Use Topics  . Smoking status: Never Smoker   . Smokeless tobacco: Not on file  . Alcohol Use: No   OB History   Grav  Para Term Preterm Abortions TAB SAB Ect Mult Living                 Review of Systems  Constitutional: Negative for fever and chills.  HENT: Negative for mouth sores and sore throat.   Respiratory: Negative for cough and shortness of breath.   Gastrointestinal: Positive for nausea. Negative for vomiting.  Genitourinary: Positive for vaginal discharge and vaginal pain.  Skin: Positive for rash.    Allergies  Review of patient's allergies indicates no known allergies.  Home Medications   Prior to Admission medications   Medication Sig Start Date End Date Taking? Authorizing Provider  DiphenhydrAMINE HCl (BENADRYL PO) Take 1 tablet by mouth daily as needed. For allergies    Historical Provider, MD  HYDROcodone-acetaminophen (NORCO) 5-325 MG per tablet Take 1 tablet by mouth every 6 (six) hours as needed for moderate pain. 03/15/14   Graylon Good, PA-C  ibuprofen (ADVIL,MOTRIN) 800 MG tablet Take 1 tablet (800 mg total) by mouth 3 (three) times daily. 03/15/14   Graylon Good, PA-C  nystatin cream (MYCOSTATIN) Apply to affected area 2-3 times daily 04/19/14   Monte Fantasia, PA-C   Triage Vitals: BP 112/69  Pulse 96  Temp(Src) 98.4 F (36.9 C) (Oral)  Resp 18  SpO2 99%  LMP 03/21/2014 Physical Exam  Nursing note and vitals reviewed. Constitutional: She is oriented to person, place, and time. She appears well-developed and well-nourished.  HENT:  Head: Normocephalic and atraumatic.  Eyes: EOM are normal.  Neck: Normal range of motion.  Cardiovascular: Normal rate.   Pulmonary/Chest: Effort normal.  Musculoskeletal: Normal range of motion.  Neurological: She is alert and oriented to person, place, and time.  Skin: Skin is warm and dry. She is not diaphoretic. No pallor.  Dry, maculopapular, mildly erythematous, Lichenified eczematous rash on upper trunk in patches, bilateral breasts superior and inferior on left breast, periumbilical region, waistline, neck, right axilla and  back in T6 region.  Psychiatric: She has a normal mood and affect. Her behavior is normal.    ED Course  Procedures (including critical care time) DIAGNOSTIC STUDIES: Oxygen Saturation is 99% on RA, normal by my interpretation.   COORDINATION OF CARE: 11:06 AM- Will speak with Dr. Silverio LayYao about appropriate course of treatment. Pt verbalizes understanding and agrees to plan.  Medications - No data to display  Labs Review Labs Reviewed - No data to display  Imaging Review No results found.   EKG Interpretation None      MDM   Final diagnoses:  Candidiasis of skin   Patient with rash consistent with possible candidiasis. We'll treat with antifungals, and strongly encouraged patient to find a primary care physician in followup to ensure resolution of symptoms. No evidence of SJS or necrotizing fasciitis. Due to pruritic and not painful nature of blisters do not suspect pemphigus vulgaris. Pustules do not resemble scabies as per pt hx or allergic reaction. No blisters, no pustules, no warmth, no draining sinus tracts, no superficial abscesses, no bullous impetigo, no vesicles, no desquamation, no target lesions with dusky purpura or a central bulla. Not tender to touch.   BP 118/70  Pulse 78  Temp(Src) 98.4 F (36.9 C) (Oral)  Resp 15  SpO2 100%  LMP 03/21/2014  Signed,  Ladona MowJoe Debanhi Blaker, PA-C 6:55 PM  I personally performed the services described in this documentation, which was scribed in my presence. The recorded information has been reviewed and is accurate.    Monte FantasiaJoseph W Ellena Kamen, PA-C 04/19/14 [phone]

## 2014-04-19 NOTE — ED Provider Notes (Signed)
Medical screening examination/treatment/procedure(s) were performed by non-physician practitioner and as supervising physician I was immediately available for consultation/collaboration.   EKG Interpretation None        Klint Lezcano H Jaque Dacy, MD 04/19/14 1957 

## 2014-04-19 NOTE — Discharge Instructions (Signed)
Candida Infection A Candida infection (also called yeast, fungus, and Monilia infection) is an overgrowth of yeast that can occur anywhere on the body. A yeast infection commonly occurs in warm, moist body areas. Usually, the infection remains localized but can spread to become a systemic infection. A yeast infection may be a sign of a more severe disease such as diabetes, leukemia, or AIDS. A yeast infection can occur in both men and women. In women, Candida vaginitis is a vaginal infection. It is one of the most common causes of vaginitis. Men usually do not have symptoms or know they have an infection until other problems develop. Men may find out they have a yeast infection because their sex partner has a yeast infection. Uncircumcised men are more likely to get a yeast infection than circumcised men. This is because the uncircumcised glans is not exposed to air and does not remain as dry as that of a circumcised glans. Older adults may develop yeast infections around dentures. CAUSES  Women  Antibiotics.  Steroid medication taken for a long time.  Being overweight (obese).  Diabetes.  Poor immune condition.  Certain serious medical conditions.  Immune suppressive medications for organ transplant patients.  Chemotherapy.  Pregnancy.  Menstruation.  Stress and fatigue.  Intravenous drug use.  Oral contraceptives.  Wearing tight-fitting clothes in the crotch area.  Catching it from a sex partner who has a yeast infection.  Spermicide.  Intravenous, urinary, or other catheters. Men  Catching it from a sex partner who has a yeast infection.  Having oral or anal sex with a person who has the infection.  Spermicide.  Diabetes.  Antibiotics.  Poor immune system.  Medications that suppress the immune system.  Intravenous drug use.  Intravenous, urinary, or other catheters. SYMPTOMS  Women  Thick, white vaginal discharge.  Vaginal itching.  Redness and  swelling in and around the vagina.  Irritation of the lips of the vagina and perineum.  Blisters on the vaginal lips and perineum.  Painful sexual intercourse.  Low blood sugar (hypoglycemia).  Painful urination.  Bladder infections.  Intestinal problems such as constipation, indigestion, bad breath, bloating, increase in gas, diarrhea, or loose stools. Men  Men may develop intestinal problems such as constipation, indigestion, bad breath, bloating, increase in gas, diarrhea, or loose stools.  Dry, cracked skin on the penis with itching or discomfort.  Jock itch.  Dry, flaky skin.  Athlete's foot.  Hypoglycemia. DIAGNOSIS  Women  A history and an exam are performed.  The discharge may be examined under a microscope.  A culture may be taken of the discharge. Men  A history and an exam are performed.  Any discharge from the penis or areas of cracked skin will be looked at under the microscope and cultured.  Stool samples may be cultured. TREATMENT  Women  Vaginal antifungal suppositories and creams.  Medicated creams to decrease irritation and itching on the outside of the vagina.  Warm compresses to the perineal area to decrease swelling and discomfort.  Oral antifungal medications.  Medicated vaginal suppositories or cream for repeated or recurrent infections.  Wash and dry the irritation areas before applying the cream.  Eating yogurt with Lactobacillus may help with prevention and treatment.  Sometimes painting the vagina with gentian violet solution may help if creams and suppositories do not work. Men  Antifungal creams and oral antifungal medications.  Sometimes treatment must continue for 30 days after the symptoms go away to prevent recurrence. HOME CARE INSTRUCTIONS  Women  Use cotton underwear and avoid tight-fitting clothing.  Avoid colored, scented toilet paper and deodorant tampons or pads.  Do not douche.  Keep your diabetes  under control.  Finish all the prescribed medications.  Keep your skin clean and dry.  Consume milk or yogurt with Lactobacillus-active culture regularly. If you get frequent yeast infections and think that is what the infection is, there are over-the-counter medications that you can get. If the infection does not show healing in 3 days, talk to your caregiver.  Tell your sex partner you have a yeast infection. Your partner may need treatment also, especially if your infection does not clear up or recurs. Men  Keep your skin clean and dry.  Keep your diabetes under control.  Finish all prescribed medications.  Tell your sex partner that you have a yeast infection so he or she can be treated if necessary. SEEK MEDICAL CARE IF:   Your symptoms do not clear up or worsen in one week after treatment.  You have an oral temperature above 102 F (38.9 C).  You have trouble swallowing or eating for a prolonged time.  You develop blisters on and around your vagina.  You develop vaginal bleeding and it is not your menstrual period.  You develop abdominal pain.  You develop intestinal problems as mentioned above.  You get weak or light-headed.  You have painful or increased urination.  You have pain during sexual intercourse. MAKE SURE YOU:   Understand these instructions.  Will watch your condition.  Will get help right away if you are not doing well or get worse. Document Released: 08/14/2004 Document Revised: 11/21/2013 Document Reviewed: 11/26/2009 [facility] Patient Information 2015 [place], [place]. This information is not intended to replace advice given to you by your health care provider. Make sure you discuss any questions you have with your health care provider.   Cutaneous Candidiasis Cutaneous candidiasis is a condition in which there is an overgrowth of yeast (candida) on the skin. Yeast normally live on the skin, but in small enough numbers not to cause any  symptoms. In certain cases, increased growth of the yeast may cause an actual yeast infection. This kind of infection usually occurs in areas of the skin that are constantly warm and moist, such as the armpits or the groin. Yeast is the most common cause of diaper rash in babies and in people who cannot control their bowel movements (incontinence). CAUSES  The fungus that most often causes cutaneous candidiasis is Candida albicans. Conditions that can increase the risk of getting a yeast infection of the skin include:  Obesity.  Pregnancy.  Diabetes.  Taking antibiotic medicine.  Taking birth control pills.  Taking steroid medicines.  Thyroid disease.  An iron or zinc deficiency.  Problems with the immune system. SYMPTOMS   Red, swollen area of the skin.  Bumps on the skin.  Itchiness. DIAGNOSIS  The diagnosis of cutaneous candidiasis is usually based on its appearance. Light scrapings of the skin may also be taken and viewed under a microscope to identify the presence of yeast. TREATMENT  Antifungal creams may be applied to the infected skin. In severe cases, oral medicines may be needed.  HOME CARE INSTRUCTIONS   Keep your skin clean and dry.  Maintain a healthy weight.  If you have diabetes, keep your blood sugar under control. SEEK IMMEDIATE MEDICAL CARE IF:  Your rash continues to spread despite treatment.  You have a fever, chills, or abdominal pain. Document Released:  03/25/2011 Document Revised: 09/29/2011 Document Reviewed: 03/25/2011 ExitCare Patient Information 2015 [employer]. This information is not intended to replace advice given to you by your health care provider. Make sure you discuss any questions you have with your health care provider.   Emergency Department Resource Guide 1) Find a Doctor and Pay Out of Pocket Although you won't have to find out who is covered by your insurance plan, it is a good idea to ask around and get recommendations.  You will then need to call the office and see if the doctor you have chosen will accept you as a new patient and what types of options they offer for patients who are self-pay. Some doctors offer discounts or will set up payment plans for their patients who do not have insurance, but you will need to ask so you aren't surprised when you get to your appointment.  2) Contact Your Local Health Department Not all health departments have doctors that can see patients for sick visits, but many do, so it is worth a call to see if yours does. If you don't know where your local health department is, you can check in your phone book. The CDC also has a tool to help you locate your state's health department, and many state websites also have listings of all of their local health departments.  3) Find a Walk-in Clinic If your illness is not likely to be very severe or complicated, you may want to try a walk in clinic. These are popping up all over the country in pharmacies, drugstores, and shopping centers. They're usually staffed by nurse practitioners or physician assistants that have been trained to treat common illnesses and complaints. They're usually fairly quick and inexpensive. However, if you have serious medical issues or chronic medical problems, these are probably not your best option.  No Primary Care Doctor: - Call Health Connect at  [phone] - they can help you locate a primary care doctor that  accepts your insurance, provides certain services, etc. - Physician Referral Service- [phone]  Chronic Pain Problems: Organization         Address  Phone   Notes  [facility]  [phone] Patients need to be referred by their primary care doctor.   Medication Assistance: Organization         Address  Phone   Notes  [facility] [address] [place], [place] [address] [phone] --Must be a resident of [facility] --  Must have NO insurance coverage whatsoever (no Medicaid/ Medicare, etc.) -- The pt. MUST have a primary care doctor that directs their care regularly and follows them in the community   MedAssist  [phone]   [employer]  [phone]    Agencies that provide inexpensive medical care: Organization         Address  Phone   Notes  [facility]  [phone]   [facility]    [phone]   [facility] [address] [place], [place] [address] [phone]   [facility] [address], [place] [phone]   Planned Parenthood    [phone]   [facility]    [phone]   [facility]  [address], [place] Phone:  [phone], Fax:  [phone] Hours of Operation:  9 am - 6 pm, M-F.  Also  accepts Medicaid/Medicare and self-pay.  [facility]  [address], [place] Phone: [phone], Fax: [phone]. Hours of Operation:  8:30 am - 5:30 pm, M-F.  Also accepts Medicaid and self-pay.  [facility] [place] [address] [place] Phone: [phone]   [facility] [address] Natasha Bence [place], [place] [phone], Ext. [phone] Mondays & Thursdays: 7-9 AM.  First 15 patients are seen on a first come, first serve basis.    Medicaid-accepting [facility] Providers:  Organization         Address  Phone   Notes  [facility] [address]  [phone] Also accepts self-pay patients.  [facility] [address] Laurell Josephs [place], [place]  [phone]   [facility] [address], [place] [phone]   [facility] [address], [place] [phone]   Renaye Rakers [address], [place]   [phone] Only accepts [place] Access [place] patients  after they have their name applied to their card.   Self-Pay (no insurance) in [facility]:  Organization         Address  Phone   Notes  Sickle Cell Patients, [facility] [address] [place], [place] [phone]   [facility] [address] [place], [place] [phone]   [facility] [place]  [address], [place] [phone]   [facility]/Dr. Osei-Bonsu  [address] [place] or [address], [place] [phone] Phone number for both [place] and [place] locations is the same.  [facility] [address], [place] [phone]   [facility] [address] [place] or [address] [phone]   [facility] [address], [place] [phone], phone; [phone], fax Sees patients 1st and 3rd Saturday of every month.  Must not qualify for public or private insurance (i.e. Medicaid, Medicare, [place] Health Choice, Veterans' Benefits)  Household income should be no more than 200% of the poverty level The clinic cannot treat you if you are pregnant or think you are pregnant  Sexually transmitted diseases are not treated at the clinic.    Dental Care: Organization         Address  Phone  Notes  [facility] Department of [facility] [address] [place], [place] [phone] Accepts children up to age 63 who are enrolled in [place] or [place] Health Choice; pregnant women with a Medicaid card; and children who have applied for Medicaid or [place] Health Choice, but were declined, whose parents can pay a reduced fee at time of service.  [facility] Department of [facility]  [address], [place] [phone] Accepts children up to age 68 who are enrolled in [place] or [place] Health Choice; pregnant women with a Medicaid card; and children  who have applied for Medicaid or [place] Health Choice, but were declined, whose parents can pay a reduced fee at time of service.  [facility]  [address] [place], [place] [phone] Patients are seen by appointment only. Walk-ins are not accepted. [facility] will see patients 38 years of age and older. Monday - Tuesday (8am-5pm)  Most Wednesdays (8:30-5pm) $30 per visit, cash only  [facility] [employer] PROGRAM  [address], [facility] [phone] Patients are seen by appointment only. Walk-ins are not accepted. [place] will see patients 15 years of age and older. One Wednesday Evening (Monthly: Volunteer Based).  $30 per visit, cash only  [place]  [phone] for adults; Children under age 67, call Graduate Pediatric Dentistry at [phone]. Children aged 66-14, please call [phone] to request a pediatric application.  Dental services are provided in all areas of dental care including fillings, crowns and bridges, complete and partial dentures, implants, gum treatment, root canals, and extractions. Preventive care is also provided. Treatment is provided to both adults and children. Patients are selected via a lottery and there is often a waiting list.   [facility] [address], [place]  [phone] [email or web address]   [facility] [address] [place], [place] [phone], Ext. [phone] Second and Fourth Thursday of each month, opens at 6:30 AM; Clinic ends at 9 AM.  Patients are seen on a first-come first-served basis, and a limited number are seen during each clinic.   [facility]  [address] Hillard Danker [place], [place] [phone]   Eligibility Requirements You must have lived in [place], [place], or [place] counties for at least the last three months.   You cannot be eligible for state or federal sponsored [employer], including [employer], [place], or [employer].   You generally cannot be eligible for healthcare insurance through your employer.    How to apply: Eligibility screenings are held every Tuesday and Wednesday afternoon from 1:00 pm until 4:00 pm. You do not need an appointment for the interview!  [facility] [address], [place], [place]   [place]  [place] Department  [place]  [phone]    Behavioral Health Resources in the Community: Intensive Outpatient Programs Organization         Address  Phone  Notes  [place]. [address], [place], [place] [phone]   [facility] [address], [place], [place]   ADS: Alcohol & Drug Svcs [address] [place], [place]   [place] [address],  [place], [place] or [phone]   Substance Abuse Resources Organization         Address  Phone  Notes  Alcohol and Drug Services  [phone]   [place]  [phone]   [place]   Chinita Pester  [phone]   Residential & Outpatient Substance Abuse Program  [phone]   Psychological Services Organization         Address  Phone  Notes  [facility]  [place]  [phone]   [place] [address], [place] or [phone]    Mobile Crisis Teams Organization         Address  Phone  Notes  [facility], Mobile Crisis Care Unit  [phone]   [facility]  [address] [place], [place]   [place] Levels [address] [place] [phone]    Self-Help/Support Groups Organization         Address  Phone  Notes  [facility] [place] -  variety of support groups  [place] Call for more information  Narcotics Anonymous (NA), [facility] [address], [employer] [place]  2 meetings at this location   Special educational needs teacher         Address  Phone  Notes  [facility] [place],    [place]  [phone]   [facility]  [address] [place] [phone], [place], [place]   [place], [place] [phone] Admissions: 8am-3pm M-F  Incentives Substance [place] [address],    [place], [place] [phone]   [facility] [address] [place], [place], [place]   [facility] [place] [facility] [address]  [place], [place]   [facility] - Intensive Outpatient [address] Kristeen Mans [address], [place], [place]   [facility] [place].,  [place], [place] [phone] or [phone]   [facility] (RTS) [address], [place], [place] Accepts Medicaid  [facility] [place] [address],  [place] [phone] Substance Abuse/Addiction Treatment   [facility] Organization         Address  Phone  Notes  [facility]  [phone]   Domenic Schwab, PhD [address] Arlis Porta [place], [place]   [phone] or [phone]   [place], [place] [phone]   [facility] [address], [place], [place] [phone] Insurance/Medicaid/sponsorship through [facility] and Families [address]                                    [place], [place] [phone] [place] [address], [place] [phone]    Dr. Adele Schilder  [phone]   [facility] Dept. 1) [address], [place] 2) [place] 3)  [place] [address], [place] [phone]  [phone]   [place] [phone] or [phone] (After Hours)
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
## 2021-05-06 ENCOUNTER — Ambulatory Visit: Payer: Self-pay | Admitting: Physician Assistant

## 2023-09-06 ENCOUNTER — Encounter (HOSPITAL_COMMUNITY): Payer: Self-pay

## 2023-09-06 ENCOUNTER — Ambulatory Visit (HOSPITAL_COMMUNITY)
Admission: EM | Admit: 2023-09-06 | Discharge: 2023-09-06 | Disposition: A | Discharge status: Home / Self Care | Payer: Self-pay | Attending: Internal Medicine | Admitting: Internal Medicine

## 2023-09-06 DIAGNOSIS — K047 Periapical abscess without sinus: Secondary | ICD-10-CM

## 2023-09-06 MED ORDER — LIDOCAINE HCL (PF) 1 % IJ SOLN
INTRAMUSCULAR | Status: AC
  Filled 2023-09-06: qty 2

## 2023-09-06 MED ORDER — KETOROLAC TROMETHAMINE 30 MG/ML IJ SOLN
30.0000 mg | Freq: Once | INTRAMUSCULAR | Status: AC
  Administered 2023-09-06: 30 mg via INTRAMUSCULAR

## 2023-09-06 MED ORDER — HYDROCODONE-ACETAMINOPHEN 5-325 MG PO TABS: 2.0000 | ORAL_TABLET | Freq: Four times a day (QID) | ORAL | 0 refills | Status: AC | PRN

## 2023-09-06 MED ORDER — KETOROLAC TROMETHAMINE 30 MG/ML IJ SOLN
INTRAMUSCULAR | Status: AC
  Filled 2023-09-06: qty 1

## 2023-09-06 MED ORDER — AMOXICILLIN-POT CLAVULANATE 875-125 MG PO TABS: 1.0000 | ORAL_TABLET | Freq: Two times a day (BID) | ORAL | 0 refills | Status: AC

## 2023-09-06 MED ORDER — CEFTRIAXONE SODIUM 500 MG IJ SOLR
INTRAMUSCULAR | Status: AC
  Filled 2023-09-06: qty 500

## 2023-09-06 MED ORDER — CEFTRIAXONE SODIUM 500 MG IJ SOLR
500.0000 mg | INTRAMUSCULAR | Status: DC
  Administered 2023-09-06: 500 mg via INTRAMUSCULAR

## 2023-09-06 NOTE — ED Provider Notes (Signed)
[facility]    CSN: [id number] Arrival date & time: 09/06/23  1722      History   Chief Complaint Chief Complaint  Patient presents with   Dental Problem    HPI Darlene Evans is a 48 y.o. female.   48 year old female who presents to urgent care with complaints of right lower jaw pain from fractured tooth.  This started 3 days ago.  The pain has gotten severe enough that she is running a fever and unable to eat or drink.  She does not have a dentist and does not have dental coverage.  She has tried over-the-counter medication that is not helping.     Past Medical History:  Diagnosis Date   Allergy     There are no active problems to display for this patient.   Past Surgical History:  Procedure Laterality Date   TUBAL LIGATION      OB History   No obstetric history on file.      Home Medications    Prior to Admission medications   Medication Sig Start Date End Date Taking? Authorizing Provider  amoxicillin-clavulanate (AUGMENTIN) 875-125 MG tablet Take 1 tablet by mouth every 12 (twelve) hours. 09/06/23  Yes Rocky Gladden A, PA-C  HYDROcodone-acetaminophen (NORCO/VICODIN) 5-325 MG tablet Take 2 tablets by mouth every 6 (six) hours as needed. 09/06/23  Yes Pristine Gladhill A, PA-C  DiphenhydrAMINE HCl (BENADRYL PO) Take 1 tablet by mouth daily as needed. For allergies    [provider]    Family History Family History  Problem Relation Age of Onset   Heart disease Maternal Grandmother     Social History Social History   Tobacco Use   Smoking status: Never  Substance Use Topics   Alcohol use: No   Drug use: No     Allergies   Patient has no known allergies.   Review of Systems Review of Systems  Constitutional:  Negative for chills and fever.  HENT:  Positive for dental problem and facial swelling. Negative for ear pain and sore throat.   Eyes:  Negative for pain and visual disturbance.  Respiratory:  Negative for cough  and shortness of breath.   Cardiovascular:  Negative for chest pain and palpitations.  Gastrointestinal:  Negative for abdominal pain and vomiting.  Genitourinary:  Negative for dysuria and hematuria.  Musculoskeletal:  Negative for arthralgias and back pain.  Skin:  Negative for color change and rash.  Neurological:  Negative for seizures and syncope.  All other systems reviewed and are negative.    Physical Exam Triage Vital Signs ED Triage Vitals  Encounter Vitals Group     BP 09/06/23 1825 (!) 171/84     Systolic BP Percentile --      Diastolic BP Percentile --      Pulse Rate 09/06/23 1825 90     Resp 09/06/23 1825 16     Temp 09/06/23 1825 100.1 F (37.8 C)     Temp Source 09/06/23 1825 Oral     SpO2 09/06/23 1825 98 %     Weight --      Height --      Head Circumference --      Peak Flow --      Pain Score 09/06/23 1824 10     Pain Loc --      Pain Education --      Exclude from Growth Chart --    No data found.  Updated Vital Signs BP Marland Kitchen)  171/84 (BP Location: Right Arm)   Pulse 90   Temp 100.1 F (37.8 C) (Oral)   Resp 16   LMP 09/06/2023 (Exact Date)   SpO2 98%   Visual Acuity Right Eye Distance:   Left Eye Distance:   Bilateral Distance:    Right Eye Near:   Left Eye Near:    Bilateral Near:     Physical Exam Vitals and nursing note reviewed.  Constitutional:      General: She is not in acute distress.    Appearance: She is well-developed.  HENT:     Head: Normocephalic and atraumatic.     Mouth/Throat:     Dentition: Dental abscesses present.   Eyes:     Conjunctiva/sclera: Conjunctivae normal.  Cardiovascular:     Rate and Rhythm: Normal rate and regular rhythm.     Heart sounds: No murmur heard. Pulmonary:     Effort: Pulmonary effort is normal. No respiratory distress.     Breath sounds: Normal breath sounds.  Abdominal:     Palpations: Abdomen is soft.     Tenderness: There is no abdominal tenderness.  Musculoskeletal:         General: No swelling.     Cervical back: Neck supple.  Skin:    General: Skin is warm and dry.     Capillary Refill: Capillary refill takes less than 2 seconds.  Neurological:     Mental Status: She is alert.  Psychiatric:        Mood and Affect: Mood normal.      UC Treatments / Results  Labs (all labs ordered are listed, but only abnormal results are displayed) Labs Reviewed - No data to display  EKG   Radiology No results found.  Procedures Procedures (including critical care time)  Medications Ordered in UC Medications  ketorolac (TORADOL) 30 MG/ML injection 30 mg (has no administration in time range)  cefTRIAXone (ROCEPHIN) injection 500 mg (has no administration in time range)    Initial Impression / Assessment and Plan / UC Course  I have reviewed the triage vital signs and the nursing notes.  Pertinent labs & imaging results that were available during my care of the patient were reviewed by me and considered in my medical decision making (see chart for details).     Dental abscess   Right lower molar tooth fracture with dental abscess.  This can be treated with the following: Toradol injection given today. This is a medication to help with pain. This is not a narcotic.  Rocephin injection given today. This is an antibiotic. Start 09/07/23 Augmentin 875 mg twice daily for 7 days.  This is an antibiotic.  Take this with food. Hydrocodone 5 mg every 6 hours as needed for pain. This is a narcotic that can make you sleepy. Do not drive while taking this medication.  The tooth will likely need to be extracted therefore contact dental next week to schedule an appointment Return to urgent care or PCP if symptoms worsen or fail to resolve.    Final Clinical Impressions(s) / UC Diagnoses   Final diagnoses:  Dental abscess     Discharge Instructions      Right lower molar tooth fracture with dental abscess.  This can be treated with the following: Toradol  injection given today. This is a medication to help with pain. This is not a narcotic.  Rocephin injection given today. This is an antibiotic. Start 09/07/23 Augmentin 875 mg twice daily for 7 days.  This is an antibiotic.  Take this with food. Hydrocodone 5 mg every 6 hours as needed for pain. This is a narcotic that can make you sleepy. Do not drive while taking this medication.  The tooth will likely need to be extracted therefore contact dental next week to schedule an appointment Return to urgent care or PCP if symptoms worsen or fail to resolve.     ED Prescriptions     Medication Sig Dispense Auth. Provider   amoxicillin-clavulanate (AUGMENTIN) 875-125 MG tablet Take 1 tablet by mouth every 12 (twelve) hours. 14 tablet Tasean Mancha A, PA-C   HYDROcodone-acetaminophen (NORCO/VICODIN) 5-325 MG tablet Take 2 tablets by mouth every 6 (six) hours as needed. 10 tablet Landis Martins, [place]      I have reviewed the PDMP during this encounter.   Landis Martins, [place] 09/06/23 1843

## 2023-09-06 NOTE — ED Triage Notes (Signed)
Patient here today with c/o right lower dental abscess X 3 days. Patient has been taking Tylenol with little relief.

## 2023-09-06 NOTE — Discharge Instructions (Addendum)
Right lower molar tooth fracture with dental abscess.  This can be treated with the following: Toradol injection given today. This is a medication to help with pain. This is not a narcotic.  Rocephin injection given today. This is an antibiotic. Start 09/07/23 Augmentin 875 mg twice daily for 7 days.  This is an antibiotic.  Take this with food. Hydrocodone 5 mg every 6 hours as needed for pain. This is a narcotic that can make you sleepy. Do not drive while taking this medication.  The tooth will likely need to be extracted therefore contact dental next week to schedule an appointment Return to urgent care or PCP if symptoms worsen or fail to resolve.
